# Patient Record
Sex: Female | Born: 2000 | Race: Black or African American | Hispanic: No | Marital: Married | Smoking: Never smoker
Health system: Southern US, Community
[De-identification: ages and names within clinical notes are randomized; demographics above are authoritative.]

---
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
## 2021-10-07 ENCOUNTER — Emergency Department (HOSPITAL_BASED_OUTPATIENT_CLINIC_OR_DEPARTMENT_OTHER)
Admission: EM | Admit: 2021-10-07 | Discharge: 2021-10-07 | Disposition: A | Discharge status: Home / Self Care | Payer: Medicaid Other | Attending: Emergency Medicine | Admitting: Emergency Medicine

## 2021-10-07 ENCOUNTER — Encounter (HOSPITAL_BASED_OUTPATIENT_CLINIC_OR_DEPARTMENT_OTHER): Payer: Self-pay

## 2021-10-07 ENCOUNTER — Emergency Department (HOSPITAL_BASED_OUTPATIENT_CLINIC_OR_DEPARTMENT_OTHER): Payer: Medicaid Other | Admitting: Radiology

## 2021-10-07 ENCOUNTER — Other Ambulatory Visit: Payer: Self-pay

## 2021-10-07 DIAGNOSIS — U071 COVID-19: Secondary | ICD-10-CM | POA: Diagnosis not present

## 2021-10-07 DIAGNOSIS — R0602 Shortness of breath: Secondary | ICD-10-CM | POA: Diagnosis present

## 2021-10-07 LAB — RESP PANEL BY RT-PCR (FLU A&B, COVID) ARPGX2
Influenza A by PCR: NEGATIVE
Influenza B by PCR: NEGATIVE
SARS Coronavirus 2 by RT PCR: POSITIVE — AB

## 2021-10-07 NOTE — ED Notes (Signed)
Patient verbalizes understanding of discharge instructions. Opportunity for questioning and answers were provided. Patient discharged from ED.  °

## 2021-10-07 NOTE — ED Provider Notes (Signed)
[facility]  Provider Note  CSN: [id number] Arrival date & time: 10/07/21 1336  History Chief Complaint  Patient presents with   Shortness of Breath    Karen Yoder is a 21 y.o. female with history of PCOS and anxiety reports she has been feeling poorly for 4-5 days with nasal congestion, ear pain and cough. No fever. She has been more anxious and she has been using her inhaler and that has made her heart race. She denies any N/V/D.    Home Medications Prior to Admission medications   Not on File     Allergies    Patient has no known allergies.   Review of Systems   Review of Systems Please see HPI for pertinent positives and negatives  Physical Exam BP 135/79    Pulse (!) 116    Temp 98.5 F (36.9 C)    Resp (!) 22    Ht 5\' 2"  (1.575 m)    Wt 80 kg    LMP 09/25/2021    SpO2 100%    BMI 32.26 kg/m   Physical Exam Vitals and nursing note reviewed.  Constitutional:      Appearance: Normal appearance.  HENT:     Head: Normocephalic and atraumatic.     Right Ear: Tympanic membrane normal.     Left Ear: Tympanic membrane normal.     Nose: Nose normal.     Mouth/Throat:     Mouth: Mucous membranes are moist.  Eyes:     Extraocular Movements: Extraocular movements intact.     Conjunctiva/sclera: Conjunctivae normal.  Cardiovascular:     Rate and Rhythm: Normal rate.  Pulmonary:     Effort: Pulmonary effort is normal.     Breath sounds: Normal breath sounds.  Abdominal:     General: Abdomen is flat.     Palpations: Abdomen is soft.     Tenderness: There is no abdominal tenderness.  Musculoskeletal:        General: No swelling. Normal range of motion.     Cervical back: Neck supple.  Skin:    General: Skin is warm and dry.  Neurological:     General: No focal deficit present.     Mental Status: She is alert.  Psychiatric:        Mood and Affect: Mood normal.    ED Results / Procedures / Treatments   EKG EKG  Interpretation  Date/Time:  Tuesday October 07 2021 13:46:53 EST Ventricular Rate:  137 PR Interval:  118 QRS Duration: 60 QT Interval:  282 QTC Calculation: 425 R Axis:   79 Text Interpretation: Sinus tachycardia Otherwise normal ECG When compared with ECG of 28-Nov-2019 20:55, No significant change was found Confirmed by Regan Lemming ([id number]) on 10/07/2021 1:56:58 PM  Procedures Procedures  Medications Ordered in the ED Medications - No data to display  Initial Impression and Plan  Patient with URI symptoms and anxiety. Monitor shows sinus tachy. I personally viewed the images from radiology studies and agree with radiologist interpretation: CXR is clear. Covid is positive. Patient has been vaccinated. No significant risk factors for worsening. Doubt she would benefit from antivirals at this point. No hypoxia in the ED. Low suspicion for PE. Recommend she continue with symptomatic care at home. Stay hydrated, RTED for any other concerns.    ED Course       MDM Rules/Calculators/A&P Medical Decision Making Given presenting complaint, I considered that admission might be necessary. After review of results from  ED lab and/or imaging studies, admission to the hospital is not indicated at this time.    Problems Addressed: COVID-19: acute illness or injury  Amount and/or Complexity of Data Reviewed Radiology: ordered and independent interpretation performed. Decision-making details documented in ED Course. ECG/medicine tests: ordered and independent interpretation performed. Decision-making details documented in ED Course.  Risk Prescription drug management. Decision regarding hospitalization.    Final Clinical Impression(s) / ED Diagnoses Final diagnoses:  [id number]    Rx / DC Orders ED Discharge Orders     None        Truddie Hidden, MD 10/07/21 1544

## 2021-10-07 NOTE — ED Triage Notes (Signed)
Patient here POV from Home with SOB.  Patient endorses URI Symptoms since Saturday. Congestion, Productive Cough, and SOB. Most Symptoms have become better with prescription Medications but states SOB has become worse.  No Known Fevers. No Body Aches.   Uses Inhaler but has no confirmed history of Asthma.   Anxious in Triage. A&Ox4. GCS 15. Ambulatory.

## 2022-05-21 ENCOUNTER — Institutional Professional Consult (permissible substitution): Payer: Medicaid Other | Admitting: Pulmonary Disease

## 2023-03-15 ENCOUNTER — Emergency Department (HOSPITAL_BASED_OUTPATIENT_CLINIC_OR_DEPARTMENT_OTHER)
Admission: EM | Admit: 2023-03-15 | Discharge: 2023-03-15 | Disposition: A | Discharge status: Home / Self Care | Payer: Medicaid Other | Source: Home / Self Care | Attending: Emergency Medicine | Admitting: Emergency Medicine

## 2023-03-15 ENCOUNTER — Emergency Department (HOSPITAL_BASED_OUTPATIENT_CLINIC_OR_DEPARTMENT_OTHER): Payer: Medicaid Other

## 2023-03-15 DIAGNOSIS — M25571 Pain in right ankle and joints of right foot: Secondary | ICD-10-CM | POA: Insufficient documentation

## 2023-03-15 DIAGNOSIS — X501XXA Overexertion from prolonged static or awkward postures, initial encounter: Secondary | ICD-10-CM | POA: Diagnosis not present

## 2023-03-15 LAB — PREGNANCY, URINE: Preg Test, Ur: NEGATIVE

## 2023-03-15 NOTE — Discharge Instructions (Signed)
Wear walking boot for comfort.  Recommend Tylenol and ibuprofen and ice for pain.  Follow-up with orthopedics.  Please return if symptoms worsen.  Overall I suspect you have ankle sprain.  There was questionable old injury to the foot bone called the talus but I have immobilized you with a brace to help.

## 2023-03-15 NOTE — ED Notes (Signed)
DC papers reviewed. No questions or concerns. No signs of distress. Pt assisted to wheelchair and out to lobby. Appropriate measures for safety taken. 

## 2023-03-15 NOTE — ED Provider Notes (Signed)
[place] [facility] AT [facility] Provider Note   CSN: [id number] Arrival date & time: 03/15/23  1711     History  Chief Complaint  Patient presents with   Ankle Pain    R    Karen Yoder is a 22 y.o. female.  Patient here with pain to the right ankle.  Difficult medical history.  She was sitting awkwardly yesterday on a beanbag for a long period of time with her foot plantarflexed.  No specific trauma otherwise.  Woke up with some swelling to the right ankle.  No prior injuries.  Denies any chest pain or shortness of breath or weakness numbness or tingling.  Pain when she ambulates.  The history is provided by the patient.       Home Medications Prior to Admission medications   Not on File      Allergies    Patient has no known allergies.    Review of Systems   Review of Systems  Physical Exam Updated Vital Signs BP (!) 145/81   Pulse 99   Temp 97.6 F (36.4 C)   Resp 16   SpO2 100%  Physical Exam Vitals and nursing note reviewed.  Constitutional:      General: She is not in acute distress.    Appearance: She is well-developed.  HENT:     Head: Normocephalic and atraumatic.  Eyes:     Conjunctiva/sclera: Conjunctivae normal.  Cardiovascular:     Rate and Rhythm: Normal rate and regular rhythm.     Pulses: Normal pulses.     Heart sounds: No murmur heard. Pulmonary:     Effort: Pulmonary effort is normal. No respiratory distress.     Breath sounds: Normal breath sounds.  Abdominal:     Palpations: Abdomen is soft.     Tenderness: There is no abdominal tenderness.  Musculoskeletal:        General: Swelling and tenderness present. Normal range of motion.     Cervical back: Neck supple.     Comments: Tenderness to the top of the right foot, some swelling to the medial malleolus area, good range of motion without much discomfort, no pain elsewhere  Skin:    General: Skin is warm and dry.     Capillary Refill: Capillary refill  takes less than 2 seconds.  Neurological:     Mental Status: She is alert.     Sensory: No sensory deficit.     Motor: No weakness.  Psychiatric:        Mood and Affect: Mood normal.     ED Results / Procedures / Treatments   Labs (all labs ordered are listed, but only abnormal results are displayed) Labs Reviewed  PREGNANCY, URINE    EKG None  Radiology DG Ankle Complete Right  Result Date: 03/15/2023 CLINICAL DATA:  Injury.  Swelling. EXAM: RIGHT ANKLE - COMPLETE 3+ VIEW COMPARISON:  None Available. FINDINGS: Small curvilinear density adjacent to the dorsal talus may represent an avulsion fracture of unknown acuity. No additional fracture. The alignment is maintained. Normal ankle mortise. No erosions or focal bone abnormality. Generalized soft tissue edema. No definite ankle joint effusion. IMPRESSION: Small curvilinear density adjacent to the dorsal talus may represent an avulsion fracture of unknown acuity. Generalized soft tissue edema. Electronically Signed   By: Narda Rutherford M.D.   On: 03/15/2023 18:34    Procedures Procedures    Medications Ordered in ED Medications - No data to display  ED Course/ Medical Decision  Making/ A&P                             Medical Decision Making Amount and/or Complexity of Data Reviewed Labs: ordered. Radiology: ordered.   Karen Yoder is here with right ankle pain.  Differential diagnosis sprain versus less likely fracture/contusion.  Unremarkable vitals.  Neurovascular neuromuscular intact on exam.  Radiology report shows small curvilinear density adjacent to the dorsal talus may represent an avulsion fracture unknown acuity.  There is some generalized edema.  It is possible that there may be a small fracture.  She not particularly tender in the dorsal talus area.  Will put in a walking boot for sprain/contusion/nondisplaced fracture.  Overall she is neurovascular neuromuscular intact.  Will have her follow-up with orthopedics.   She will use Tylenol, ibuprofen and ice.  Weightbearing as tolerated with crutches.  Discharged in good condition.  Understands return precautions.        Final Clinical Impression(s) / ED Diagnoses Final diagnoses:  Acute right ankle pain    Rx / DC Orders ED Discharge Orders     None         Virgina Norfolk, DO 03/15/23 1949

## 2023-03-15 NOTE — ED Triage Notes (Signed)
Pt states she thinks she has "high ankle sprain," swelling, burning up top. States she was standing on tip toes on beanbag, but denies specific known injury/ "nothing major." CNS intact distal to injury

## 2023-07-31 IMAGING — DX DG CHEST 2V
2 series · 2 of 2 positions shown · non-contrast
Comparison: November 28, 2019.

CLINICAL DATA: SOB

EXAM:
CHEST - 2 VIEW

[chest pa]
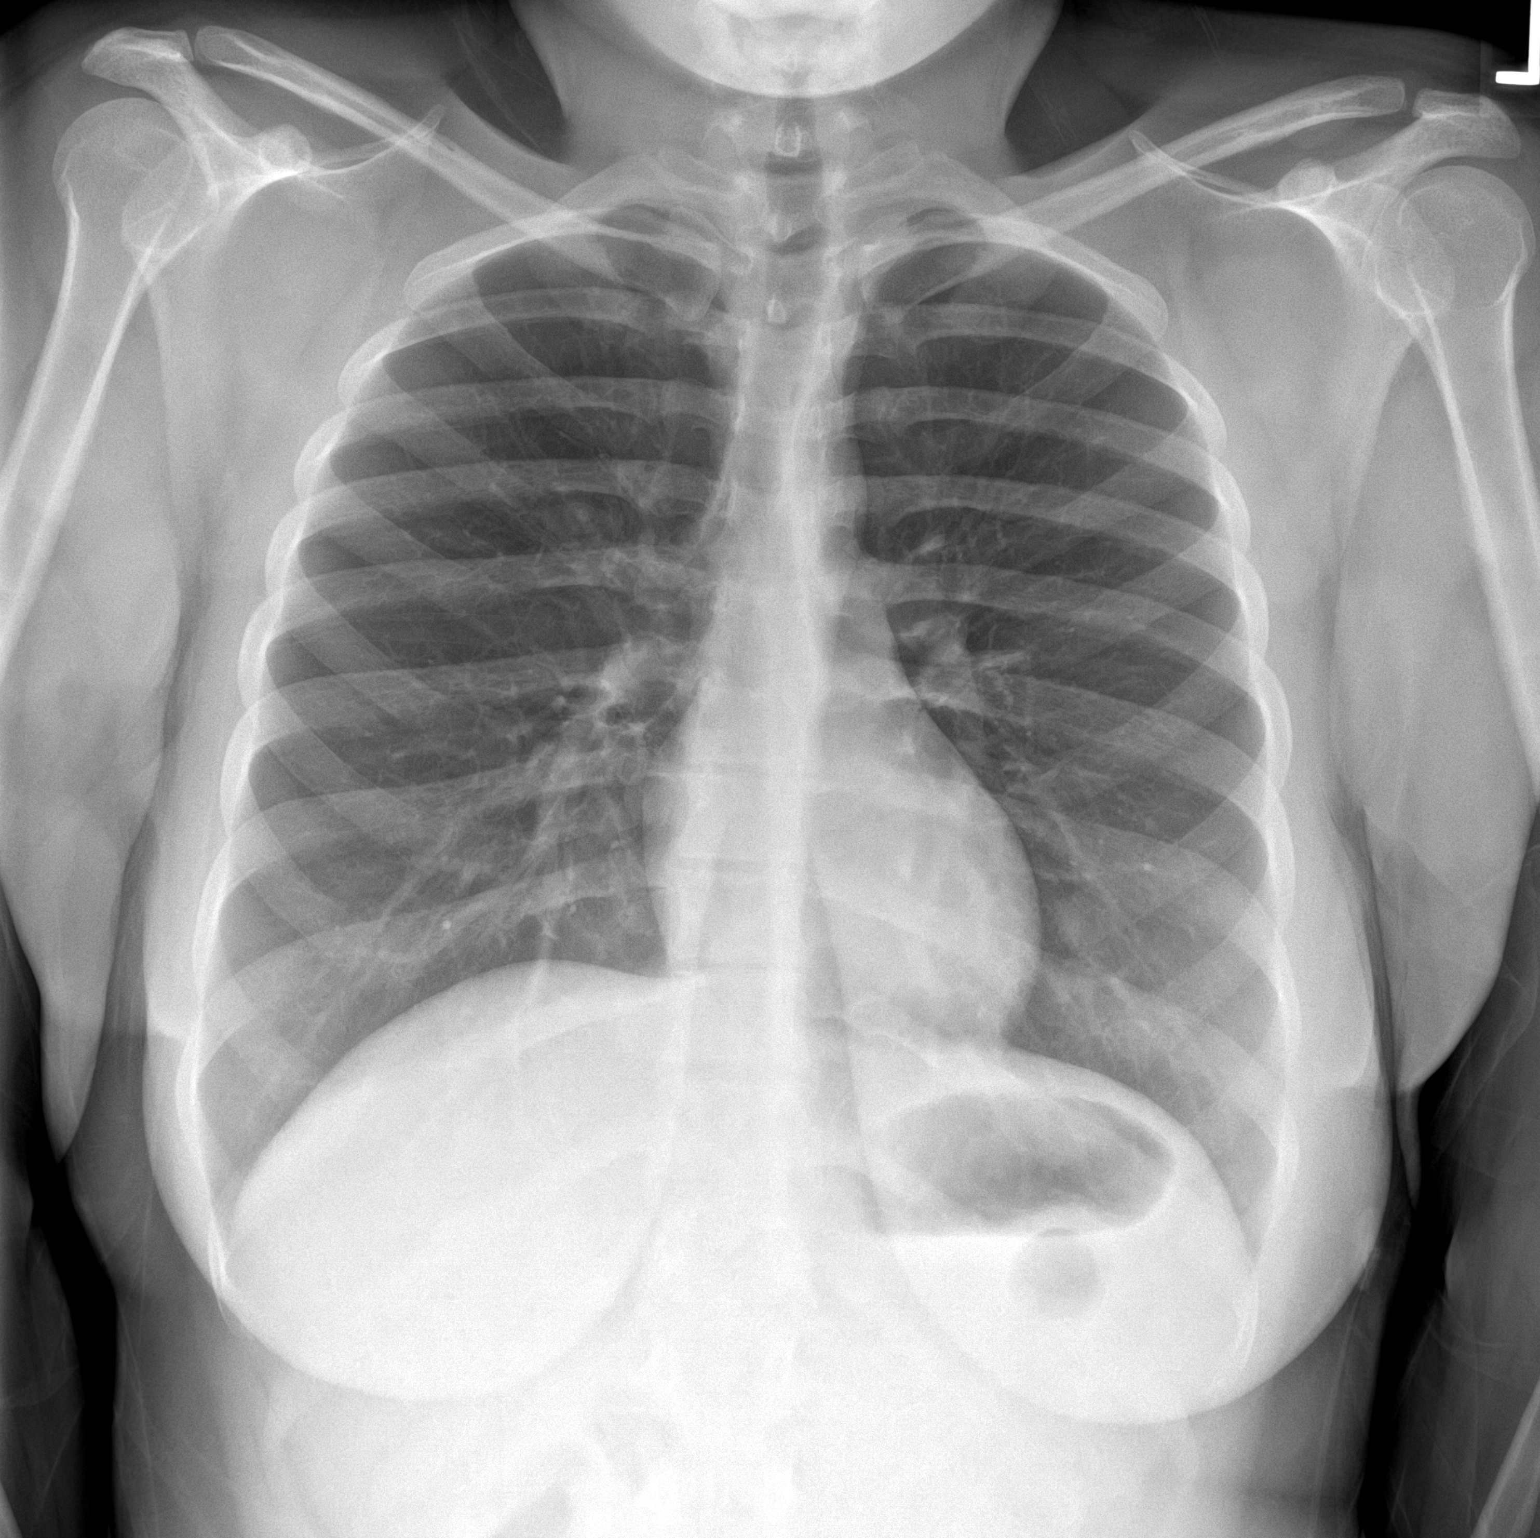

[chest lat]
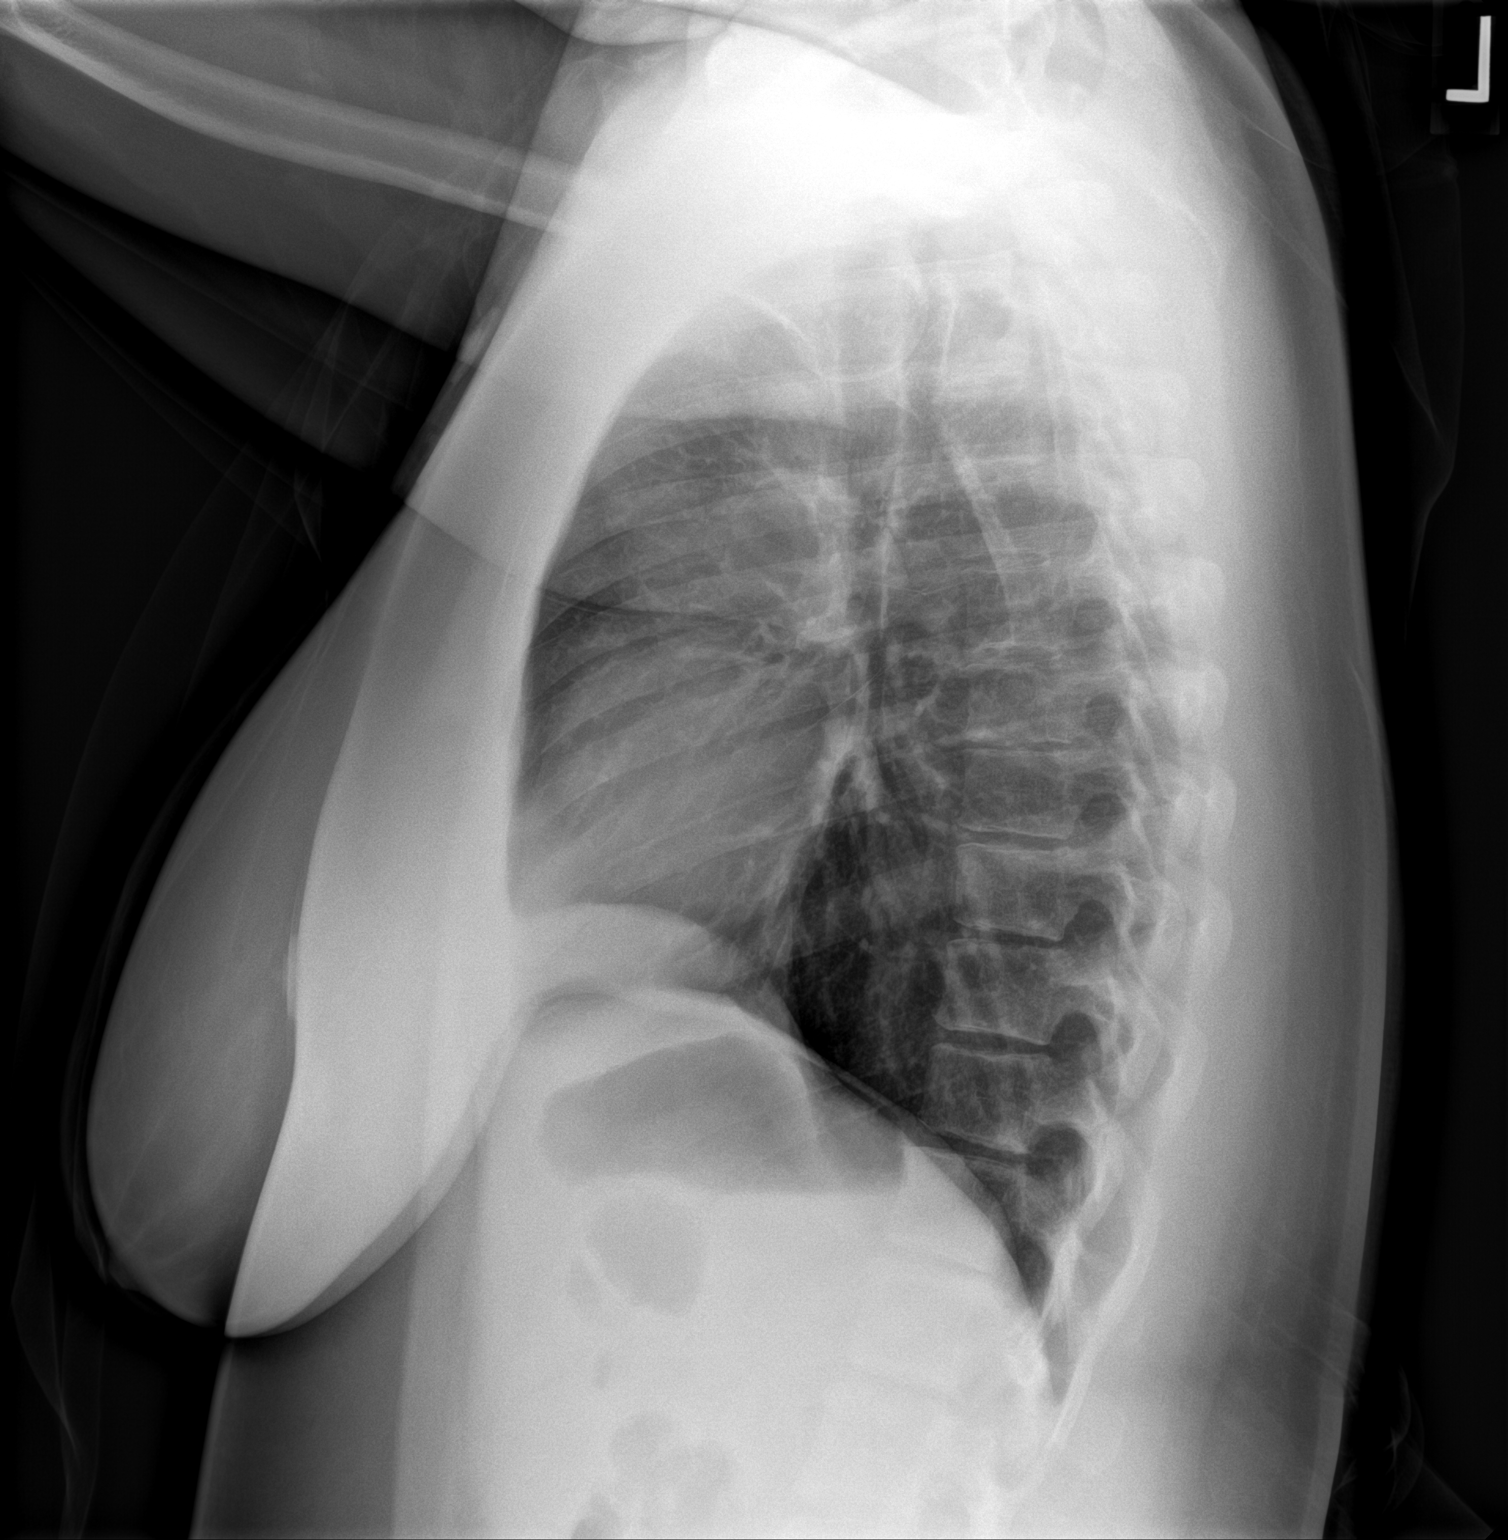

[2 of 2 positions shown; findings below may reference images not displayed]

FINDINGS: No consolidation. No visible pleural effusions or pneumothorax.
Cardiomediastinal silhouette is within normal limits. No change from
the prior.
IMPRESSION: No evidence of acute cardiopulmonary disease.

## 2024-04-28 NOTE — Progress Notes (Signed)
 Assessment and Plan   1. Annual physical exam  CBC And Differential   Comprehensive Metabolic Panel   Lipid Panel With LDL/HDL Ratio   Lipid Panel With LDL/HDL Ratio   Comprehensive Metabolic Panel   CBC And Differential    2. PCOS (polycystic ovarian syndrome)  Ambulatory referral to Obstetrics / Gynecology   POCT A1C   POCT A1C    3. Anxiety, generalized      4. Seasonal allergies      5. Major depressive disorder, single episode, severe (*)      6. Screening for cervical cancer  IGP, Aptima HPV, rfx 16/18,45 Cervical   IGP, Aptima HPV, rfx 16/18,45 Cervical    7. Screen for STD (sexually transmitted disease)      8. Dizziness  Fe+TIBC+Fer   Vitamin D 25 Hydroxy   TSH+Free T4   Vitamin B12   BV, Candida, TV, CT, & GC, NAA vag swab Vaginal   BV, Candida, TV, CT, & GC, NAA vag swab Vaginal   Vitamin B12   TSH+Free T4   Vitamin D 25 Hydroxy   Fe+TIBC+Fer    9. PND (post-nasal drip)      10. Nasal congestion      11. Urinary frequency  Urinalysis   Urinalysis    12. Gastroesophageal reflux disease without esophagitis  omeprazole (PRILOSEC) 40 mg capsule     Assessment & Plan  1.  Wellness - see orders placed for this visit. The patient is asked to make an attempt to improve diet and exercise patterns to aid in health maintenance.  Immunizations have been reviewed and updated as noted.  Recommend repeat health maintenance exam in 1 year(s).  See patient instructions for further information.  Screening blood work CMP CBC lipid.  Cervical cancer screening Pap smear performed STD screening vaginal swab performed.    Issues addressed outside scope of physical:  PCOS check an A1c refer to gynecology in [place] she does not want to drive anywhere other than [place] most recently seen in the [place] once an area.  Recently started on Prometrium  Seasonal allergies-recently had a flare advised to continue with Zyrtec azelastine nasal spray Pataday, and  Flonase as needed  GERD not to goal discussed foods to avoid and will place her on omeprazole 40 mg daily each morning for a few weeks if improved may just take as needed or mom down to a Pepcid  Asthma stable with albuterol as needed  Urinary frequency check urinalysis  Dizziness possibly related to seasonal allergies but will also check blood work and continue to monitor.  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 Reflects encounter med changes as of last refresh          New Prescriptions      Instructions  omeprazole 40 mg capsule Commonly known as: PRILOSEC Started by: Comer Hemberg  40 mg, Oral, Daily  Continued Medications      Instructions  * Albuterol Sulfate 108 (90 Base) MCG/ACT Aepb inhaler Commonly known as: ProAir Respiclick  2 puffs, Inhalation, Every 6 hours as needed   * albuterol sulfate HFA 108 (90 Base) MCG/ACT inhaler Commonly known as: PROVENTIL,VENTOLIN,PROAIR  2 puffs, Inhalation, Every 6 hours   azelastine 0.1 % nasal spray Commonly known as: ASTELIN  2 sprays, Both Nostrils, 2 times a day   cetirizine 10 mg tablet Commonly known as: ZYRTEC  10 mg, Oral, At bedtime   escitalopram oxalate 20 mg tablet Commonly known as: LEXAPRO  20 mg, Oral, Daily   fluticasone propionate 50 mcg/actuation nasal spray Commonly known as: FLONASE  1-2 sprays, Nasal, Daily   olopatadine HCl  0.2 % ophthalmic solution Commonly known as: PATADAY  1 drop, Both Eyes, Daily, Instill one drop in each eye q day.   progesterone 200 mg capsule Commonly known as: PROMETRIUM  TAKE 1 CAPSULE BY MOUTH DAILY AT BEDTIME FOR CYCLE DAYS 18-27   VITAMIN D PO  Take by mouth.      * * This list has 2 medication(s) that are the same as other medications prescribed for you. Read the directions carefully, and ask your doctor or other care provider to review them with you.          Discontinued Medications    fish oil 1000 mg Caps Commonly known as: SEA-OMEGA Stopped by: Comer Reid   metFORMIN ER 500 mg 24 hr tablet Commonly known as: GLUCOPHAGE-XR Stopped by: Comer Reid   MYODYNE EX Stopped by: Comer Reid       Orders Placed This Encounter  Procedures  . BV, Candida, TV, CT, & GC, NAA vag swab Vaginal  . IGP, Aptima HPV, rfx 16/18,45 Cervical  . CBC And Differential  . Comprehensive Metabolic Panel  . Lipid Panel With LDL/HDL Ratio  . Fe+TIBC+Fer  . Vitamin D 25 Hydroxy  . TSH+Free T4  . Vitamin B12  . Urinalysis  . Ambulatory referral to Obstetrics / Gynecology  . POCT A1C    Risks, benefits, and alternatives of the medications and treatment plan prescribed today were discussed, and patient expressed understanding. Plan follow-up as discussed or as needed if any worsening symptoms or change in condition.       Subjective   Patient ID:  Karen Yoder is a 23 y.o. (DOB 10/31/2000) female.     Patient presents with  . Medication Management    Req. TSH checked, and Pap, also discuss overall health    Karen Yoder presents today for her annual physical exam.  Her fianc is with her today.  They live together she does not have any children.  She reports she quit smoking marijuana and quit drinking alcohol a month ago.  Admittedly was smoking about 2 blunts a day and had episodes of binge drinking.  Last visit with me July 2024  Additional  complaints beyond the scope of the Wellness Exam include:  She reports many complaints today that she would like addressed.  She reports over the last month or 2 she has had urinary frequency night sweats shortness of breath at rest chest congestion postnasal drip lightheadedness dizziness vision blurred sore throat sore tongue coughing up some mucus blood noted in the mucus a small amount, some pain to the right side of her neck unsure if it is a muscle or something else, increased reflux and burping, she does have seasonal allergies currently back on her allergy medications  for the past month, reports ongoing PCOS cysts on her ovaries and has seen gynecology, desires all cancer screenings is much as possible due for Pap smear.  Would like thyroid levels checked.  Reports she has had decreased immune system over the past year seems to get sick often.  She is taking Lexapro although admittedly taking on and off for the last year but more consistently over the last few months.  Has been seen by gynecology started on Prometrium but would like to see a gynecologist in [place] which is closer for her to drive   History of Present Illness   Results  Allergies[1]  Medications Taking[2]  Past Medical History:  Diagnosis Date  . Asthma (*)   . Overweight(278.02)    Past Surgical History:  Procedure Laterality Date  . No known surgical history  11/24/2017   Social History[3]  Family History  Problem Relation Age of Onset  . Ovarian cysts Mother   . No Known Problems Father   . Breast cancer Neg Hx   . Ovarian cancer Neg Hx   . Colon cancer Neg Hx   . Diabetes Neg Hx   . Hypertension Neg Hx    Immunization History  Administered Date(s) Administered  . DTaP (Infanrix) 12/27/2000, 03/14/2001, 05/05/2001, 06/09/2002, 04/05/2006  . Gardasil 9 12/27/2015, 07/06/2018, 10/12/2018  . HPV Quadrivalent 05/05/2013  . Hepatitis A Pediatric/Adolescent 2 Dose (Havrix,Vaqta) 04/18/2010, 05/05/2013   . Hepatitis B adolescent/pediatric [birth year] 2000, 11/22/2000, 12/30/2001  . Hib, Unspecified Formulation 12/27/2000, 03/14/2001, 05/05/2001, 06/09/2002  . IPV 12/27/2000, 03/14/2001, 05/05/2001, 04/05/2006  . Influenza Fluzone Quad 0.72ml 06/08/2018  . Influenza Fluzone Quad 5ml 06/09/2002  . MMR 12/30/2001, 04/05/2006  . Meningococcal ACWY vaccine, unspecified formulation 05/05/2013  . Meningococcal Con - Menactra 11/24/2017  . Pfizer-BioNTech COVID-19 Monovalent Vaccine mRNA 03/09/2020, 03/30/2020  . Pneumococcal Conjugate 7 (Prevnar) 12/27/2000, 03/14/2001, 05/05/2001, 06/09/2002  . Tdap 05/05/2013  . Varicella 12/30/2001, 04/18/2010    Review of Systems  Constitutional:  Negative for activity change, appetite change, chills, diaphoresis, fatigue, fever and unexpected weight change.  HENT:  Positive for congestion, postnasal drip, sinus pressure and sore throat. Negative for ear discharge, ear pain, hearing loss, mouth sores, rhinorrhea, sneezing, tinnitus, trouble swallowing and voice change.   Eyes:  Positive for visual disturbance.  Respiratory:  Positive for shortness of breath. Negative for cough, chest tightness and wheezing.   Cardiovascular:  Positive for chest pain. Negative for palpitations and leg swelling.  Gastrointestinal:  Negative for abdominal distention, abdominal pain, blood in stool, constipation, diarrhea, nausea and vomiting.  Endocrine: Negative for cold intolerance, heat intolerance, polydipsia, polyphagia and polyuria.  Genitourinary:  Positive for frequency and menstrual problem. Negative for dysuria, urgency and vaginal discharge.  Musculoskeletal:  Negative for arthralgias, back pain, gait problem, myalgias, neck pain and neck stiffness.  Skin:  Negative for color change, rash and wound.  Neurological:  Positive for dizziness and light-headedness. Negative for tremors, weakness, numbness and headaches.  Hematological:  Negative for adenopathy. Does not  bruise/bleed easily.  Psychiatric/Behavioral:  Negative for agitation, dysphoric mood and sleep disturbance. The patient is nervous/anxious.      Objective   Vitals:   04/28/24 1441  BP: 126/86  Pulse: 107  Resp: 18  Temp: 97.3 F (36.3 C)  SpO2: 99%   Physical Exam  Physical Exam Vitals and nursing note reviewed.  Constitutional:      General: She is not in acute distress.    Appearance: She is well-developed. She is not  diaphoretic.  HENT:     Head: Normocephalic and atraumatic.     Right Ear: Tympanic membrane, ear canal and external ear normal.     Left Ear: Tympanic membrane, ear canal and external ear normal.     Nose: Congestion present.     Right Sinus: No maxillary sinus tenderness or frontal sinus tenderness.     Left Sinus: No maxillary sinus tenderness or frontal sinus tenderness.     Mouth/Throat:     Lips: Pink.     Mouth: Mucous membranes are moist.     Pharynx: Postnasal drip present. No oropharyngeal exudate.     Tonsils: No tonsillar exudate.  Eyes:     General:        Right eye: No discharge.        Left eye: No discharge.     Conjunctiva/sclera: Conjunctivae normal.     Pupils: Pupils are equal, round, and reactive to light.  Neck:     Thyroid: No thyroid mass, thyromegaly or thyroid tenderness.     Vascular: No carotid bruit or JVD.     Trachea: No tracheal deviation.      Comments: Muscular tenderness to this area Cardiovascular:     Rate and Rhythm: Normal rate and regular rhythm.     Heart sounds: Normal heart sounds. No murmur heard.    No friction rub. No gallop.  Musculoskeletal:        General: Normal range of motion.     Cervical back: Normal range of motion and neck supple. Muscular tenderness present. No spinous process tenderness.  Pulmonary:     Effort: Pulmonary effort is normal. No respiratory distress.     Breath sounds: Normal breath sounds. No wheezing or rales.  Abdominal:     General: Bowel sounds are normal. There is no  distension.     Palpations: Abdomen is soft. There is no mass.     Tenderness: There is no abdominal tenderness. There is no guarding or rebound.  Lymphadenopathy:     Cervical: No cervical adenopathy.  Skin:    General: Skin is warm and dry.     Findings: No erythema or rash.  Neurological:     Mental Status: She is alert and oriented to person, place, and time.     Cranial Nerves: No cranial nerve deficit.     Motor: No abnormal muscle tone.     Coordination: Coordination normal.     Deep Tendon Reflexes: Reflexes are normal and symmetric. Reflexes normal.  Psychiatric:        Attention and Perception: Attention normal.        Mood and Affect: Mood is anxious.        Speech: Speech normal.        Behavior: Behavior normal.        Thought Content: Thought content normal.        Cognition and Memory: Cognition normal.        Judgment: Judgment normal.     Recent Results (from the past 2 weeks)  Urinalysis   Collection Time: 04/28/24  3:06 PM  Result Value Ref Range   Urine Color Yellow    Urine Clarity Clear    Urine Glucose Negative Negative mg/dL   Urine Bilirubin Negative Negative mg/dL   Urine Ketones Negative Negative mg/dl   Urine Specific Gravity >=1.030 1.005 - 1.030   Urine pH 6.0 5.0 - 9.0   Urine Protein - Dipstick Negative Negative mg/dl   Urine  Urobilinogen 0.2 0.2 , 1.0 mg/dl   Urine Nitrite Negative Negative   Urine Leukocyte Esterase Negative Negative Leu/mcL   Urine Blood Small (A) Negative mg/dL  POCT J8R   Collection Time: 04/28/24  3:06 PM  Result Value Ref Range   Hemoglobin A1c 5.4 4.8 - 5.6 %  CBC And Differential   Collection Time: 04/28/24  3:06 PM  Result Value Ref Range   WBC 4.4 3.7 - 11.0 thou/mcL   RBC 5.02 (H) 4.01 - 4.90 million/mcL   HGB 14.0 12.2 - 14.9 gm/dL   HCT 58.2 64.1 - 52.0 %   MCV 83.1 82.0 - 98.0 fL   MCH 27.9 27.0 - 33.0 pg   MCHC 33.6 31.0 - 37.0 gm/dL   Plt Ct 742 849 - 599 thou/mcL   RDW CV 13.1 11.8 - 14.9 %    NEUTROPHIL % 53.0 %   LYMPHOCYTE % 39.6 %   MID% 7.4 %   ABSOLUTE NEUTROPHIL COUNT 2.30 1.50 - 7.50 thou/mcL   ABSOLUTE LYMPHOCYTE COUNT 1.70 1.00 - 4.50 thou/mcL   ABSOLUTE MID <0.5 0.1 - 1.5 thou/mcL    Portions of the history and exam were entered using voice recognition software.  Minor syntax and spelling errors may be related to the use of this software and were not intentional.  Attestation         [1] Allergies Allergen Reactions  . Pollen Extract Itching  . Other Dizziness  . Seasonal Other  [2] Outpatient Medications Marked as Taking for the 04/28/24 encounter (Office Visit) with Katherine Hemberg V, NP  Medication Sig Dispense Refill  . Albuterol Sulfate (PROAIR RESPICLICK) 108 (90 Base) MCG/ACT AEPB inhaler Inhale two puffs into the lungs every 6 (six) hours as needed for Wheezing. 1 each 5  . albuterol sulfate HFA (PROVENTIL,VENTOLIN,PROAIR) 108 (90 Base) MCG/ACT inhaler Inhale two puffs into the lungs every 6 (six) hours. 108 g 1  . azelastine (ASTELIN) 0.1 % nasal spray two sprays by Both Nostrils route 2 (two) times daily. 30 mL 0  . cetirizine (ZYRTEC) 10 mg tablet TAKE 1 TABLET BY MOUTH EVERYDAY AT BEDTIME 30 tablet 3  . escitalopram oxalate (LEXAPRO) 20 mg tablet TAKE ONE TABLET BY MOUTH DAILY. 30 tablet 2  . fluticasone propionate (FLONASE) 50 mcg/actuation nasal spray one spray to two sprays by Nasal route daily. 16 g PRN  . olopatadine HCl (PATADAY) 0.2 % ophthalmic solution Place one drop into both eyes daily. Instill one drop in each eye q day. 2.5 mL 3  . progesterone (PROMETRIUM) 200 mg capsule TAKE 1 CAPSULE BY MOUTH DAILY AT BEDTIME FOR CYCLE DAYS 18-27 30 capsule 0  . VITAMIN D PO Take by mouth.    [3] Social History Socioeconomic History  . Marital status: Significant Other  Occupational History  . Occupation: [employer] at [employer]  . Smoking status: Never  . Smokeless tobacco: Never  Vaping Use  . Vaping status: Never Used   Substance and Sexual Activity  . Alcohol use: Not Currently  . Drug use: Yes    Types: Marijuana    Comment: THC products  . Sexual activity: Yes    Partners: Male    Birth control/protection: None  *Some images could not be shown.

## 2024-05-05 NOTE — Progress Notes (Signed)
 Otolaryngology Clinic Note  HPI:    New Patient (Pt presents for shortness of breath, post nasal drip, congestion and itchy  throat  )    Karen Yoder is a 23 y.o. female who presents as a new patient, referred by No ref. provider found, for evaluation and treatment of shortness of breath, congestion, and acid reflux.  She has been experiencing persistent shortness of breath and congestion for the past 4 years. She also reports a sensation of throat thickness, chest congestion, constant postnasal drip, and sinus pressure. Her nasal passages are often blocked, although they occasionally clear up. The pressure is typically worse on the left side. She suspects inflammation in her nasal tissue. She has a history of smoking marijuana with nicotine wraps for 7 years but quit a month ago. She contracted COVID-19 four times about 2 years ago. She has a lifelong history of allergies requiring medication, but she has never undergone allergy testing. She knows she is allergic to pollen. She takes cetirizine and Flonase year-round for her allergies.  She was recently diagnosed with asthma by her primary care physician and uses an albuterol pump. Her shortness of breath is interfering with her work and physical activities. She has acid reflux and started taking medication for it about a week ago. She had an ear infection 2 to 3 years ago that required antibiotic treatment. She denies history of sinonasal surgery or nasal trauma.  PMH/Meds/All/SocHx/FamHx/ROS:   Medical History[1]  Surgical History[2]  No family history of bleeding disorders, wound healing problems or difficulty with anesthesia.   Social History   Socioeconomic History  . Marital status: Single    Spouse name: Not on file  . Number of children: Not on file  . Years of education: Not on file  . Highest education level: Not on file  Occupational History  . Not on file  Tobacco Use  . Smoking status: Former    Types: Cigarettes   . Smokeless tobacco: Never  . Tobacco comments:    black and milds/ one every other day  Substance and Sexual Activity  . Alcohol use: Yes  . Drug use: Yes    Types: Marijuana  . Sexual activity: Not on file    Comment: some unprotected sex at times  Other Topics Concern  . Not on file  Social History Narrative   Pt lives with mom, stepdad and three siblings. Goes to [employer], in 7th grade.   Social Drivers of Health   Food Insecurity: No Food Insecurity (04/28/2024)   Received from [facility]   Food vital sign   . Within the past 12 months, you worried that your food would run out before you got money to buy more: Never true   . Within the past 12 months, the food you bought just didn't last and you didn't have money to get more: Never true  Transportation Needs: No Transportation Needs (04/28/2024)   Received from [facility] - Transportation   . In the past 12 months, has lack of transportation kept you from medical appointments or from getting medications?: No   . In the past 12 months, has lack of transportation kept you from meetings, work, or from getting things needed for daily living?: No  Safety: Not on file  Living Situation: Low Risk  (04/28/2024)   Received from [facility] Situation   . In the last 12 months, was there a time when you were not able to  pay the mortgage or rent on time?: No   . In the past 12 months, how many times have you moved where you were living?: 0   . At any time in the past 12 months, were you homeless or living in a shelter (including now)?: No    Current Medications[3]  A complete ROS was performed with pertinent positives/negatives noted in the HPI. The remainder of the ROS are negative.    Physical Exam:    BP 121/76   Pulse 89   Temp 98 F (36.7 C) (Temporal)   Ht 1.6 m (5' 3)   Wt 93.4 kg (205 lb 12.8 oz)   BMI 36.46 kg/m    General: Well developed, well nourished. No acute distress. Voice  normal, no voice breaks  Head/Face: Normocephalic, atraumatic. No scars or lesions. No sinus tenderness. Facial nerve intact and equal bilaterally.   Eyes: Pupils are equal, round and reactive to light. Conjunctiva and lids are normal. Normal extraocular mobility.   Ears:   Right: Pinna and external meatus normal, normal ear canal skin and caliber without excessive cerumen or drainage.    Left: Pinna and external meatus normal, normal ear canal skin and caliber without excessive cerumen or drainage.   Nose: No gross deformity or lesions. No purulent discharge. Septum midline, 2-3+ turbinate hypertrophy with allergic appearing mucosa.  Mouth/Oropharynx: Lips normal, teeth and gums normal with good dentition, normal oral vestibule. Normal floor of mouth, tongue and oral mucosa, no mucosal lesions, ulcer or mass, normal tongue mobility. Uvula midline. Hard and soft palate normal with normal mobility. Symmetric 1+ tonsils, no erythema or exudate.  Larynx: See TFL if applicable  Nasopharynx: See TFL if applicable  Neck: Trachea midline. No masses. No crepitus. Normal thyroid glad palpation without thyromegaly or nodules palpated. Salivary glands normal to palpation without swelling, erythema or mass.   Lymphatic: No lymphadenopathy in the neck.  Respiratory: No stridor or distress.  Extremities: No edema or cyanosis. Warm and well-perfused.  Neurologic: CN II-XII intact. Alert and oriented to self, place and time.  Normal reflexes and motor skills, balance and coordination. Moving all extremities without gross abnormality.  Psychiatric:  No unusual anxiety or evidence of depression. Appropriate affect.    Independent Review of Additional Tests or Records:  None  Procedures:   Laryngoscopy  Date/Time: 05/05/2024 2:45 PM  Performed by: Gerard Jenkins Shope, DO Authorized by: Gerard Jenkins Shope, DO  Local anesthesia used: yes  Anesthesia: Local anesthesia used: yes Local Anesthetic:  topical anesthetic  Sedation: Patient sedated: no  Comments: Flexible Laryngoscopy Procedure Note  Indications: Globus sensation  Risks, benefits and clinical relevance of the study were discussed. The patient understands and agrees to proceed.   Procedure: After adequate topical anesthetic was applied, 4 mm flexible laryngoscope was passed through the nasal cavity without difficulty. Flexible laryngoscopy shows patent anterior nasal cavity with minimal crusting, no discharge or infection.  Mild interarytenoid edema and erythema consistent with patient's known history of GERD, otherwise normal base of tongue and supraglottis Normal vocal cord mobility without vocal cord nodule, mass, polyp or tumor. Hypopharynx normal without mass, pooling of secretions or aspiration.  Patient tolerated procedure without complication or difficulty.     Nasal/Sinus Endoscopy  Date/Time: 05/05/2024 2:45 PM  Performed by: Gerard Jenkins Shope, DO Authorized by: Gerard Jenkins Shope, DO  Local anesthesia used: yes  Anesthesia: Local anesthesia used: yes Local Anesthetic: topical anesthetic Comments: Procedure Note - Nasal Endoscopy   Risks/benefits and possible  complications of this procedure were discussed in detail and the patient understood and agreed to proceed.  The nose was sprayed with oxymetazoline and 4% lidocaine. With the patient in the upright position, the flexible endscope was inserted into the nasal passage bilaterally.  Where visible, nasal secretions and mucosal crusting were removed with suction. The overall appearance of the nasal cavity and paranasal sinuses were noted and the findings are described below.  Findings: Relatively midline nasal septum with spur noted along the floor the nose on the right posteriorly, with moderate allergic edema of bilateral inferior turbinates, clear rhinorrhea noted with no evidence of mucopurulent drainage, nasal polyps or focal  obstruction.   The patient tolerated the procedure without difficulty.         Impression & Plans:  Karen Yoder is a 23 y.o. female with history of allergic rhinitis, acid reflux, currently on PPI, asthma presenting for evaluation of shortness of breath, postnasal drainage, facial pressure.  Complete head neck examination today including fiberoptic laryngoscopy and nasal endoscopy with findings as above, overall reassuring.  No evidence of mucopurulent drainage, nasal obstruction, nasal polyps.  Patient has an arytenoid edema and erythema consistent with known history of GERD.  I recommend beginning Astelin nasal spray in conjunction with daily use of Flonase and antihistamine, and referral to allergy and immunology, given patient's symptoms appear to be largely allergy mediated.  Recommend she also discuss her shortness of breath symptoms with the allergist. We discussed dietary lifestyle modifications for treatment of reflux to include avoiding spicy and acidic foods which exacerbate reflux, avoiding late meals and snacking, avoiding caffeine and stimulants, avoiding carbonated and alcoholic beverages, adequate oral hydration, avoidance of throat clearing and sleeping with the head of bed elevated.  Follow-up with ENT as needed.  Meghan Jenkins Shope, DO Otolaryngology         [1] Past Medical History: Diagnosis Date  . Allergy   . Anxiety   . Headache(784.0)   . Mental disorder    depression   [2] Past Surgical History: Procedure Laterality Date  . NO PAST SURGERIES     Procedure: NO PAST SURGERIES  [3]  Current Outpatient Medications:  .  albuterol HFA (PROVENTIL HFA;VENTOLIN HFA;PROAIR HFA) 90 mcg/actuation inhaler, Inhale 2 puffs every 6 (six) hours., Disp: , Rfl:  .  albuterol sulfate (PROAIR RESPICLICK) 90 mcg/actuation aepb inhaler, Inhale 2 puffs every 6 (six) hours as needed., Disp: , Rfl:  .  cetirizine (ZyrTEC) 10 mg tablet, Take 10 mg by mouth nightly., Disp:  , Rfl:  .  cholecalciferol (VITAMIN D3) 125 mcg (5,000 unit) capsule, Take 5,000 Units by mouth daily., Disp: , Rfl:  .  fluticasone propionate (FLONASE) 50 mcg/spray nasal spray, Administer 2 sprays into each nostril., Disp: , Rfl:  .  norgestimate-ethinyl estradioL (SPRINTEC) 0.25-35 mg-mcg tab tablet, Take one tablet daily (no placebos) Note that 4 packs = 90 days supply, Disp: 112 tablet, Rfl: 3 .  omeprazole (PriLOSEC) 40 mg DR capsule, Take 40 mg by mouth daily., Disp: , Rfl:  .  azelastine (ASTELIN) 137 mcg (0.1 %) nasal spray, Administer 2 sprays into each nostril 2 (two) times a day. 1 or 2 sprays per nostril twice daily as needed, Disp: 30 mL, Rfl: 11 .  azithromycin (ZITHROMAX) 500 mg tablet, Take two tablets at same time (Patient not taking: Reported on 05/05/2024), Disp: 2 tablet, Rfl: 0 .  fluconazole (DIFLUCAN) 150 mg tablet, Take 150 mg by mouth every other day. (Patient not taking: Reported  on 05/05/2024), Disp: 2 tablet, Rfl: 2

## 2024-06-29 ENCOUNTER — Encounter: Payer: Self-pay | Admitting: Physician Assistant

## 2024-06-29 ENCOUNTER — Ambulatory Visit: Admitting: Physician Assistant

## 2024-06-29 VITALS — BP 122/68 | HR 89 | Ht 62.0 in | Wt 224.6 lb

## 2024-06-29 DIAGNOSIS — E282 Polycystic ovarian syndrome: Secondary | ICD-10-CM | POA: Diagnosis not present

## 2024-06-29 DIAGNOSIS — F32A Depression, unspecified: Secondary | ICD-10-CM

## 2024-06-29 DIAGNOSIS — Z3200 Encounter for pregnancy test, result unknown: Secondary | ICD-10-CM

## 2024-06-29 LAB — POCT URINE PREGNANCY: Preg Test, Ur: NEGATIVE

## 2024-06-29 NOTE — Progress Notes (Signed)
 PAP 04/2024; pt states normal results.   Pt trying to conceive. States she was diagnosed with PCOS at 23 years of age. Pt would like to discuss this with provider. Hoping to find ways to become healthier for pregnancy and control PCOS symptoms.   Pt states she was diagnosed with depression last year.   PHQ of 12 and 16 on GAD; referral sent; pt aware and agreeable to counselor reaching out.

## 2024-06-29 NOTE — Progress Notes (Signed)
 GYNECOLOGY  VISIT   HPI: Karen Yoder is a 23 y.o.   single female [id number] Referred to us  by her PCP for TTC with history of PCOS. Patient previously experienced periods that were irregular and spaced far apart. She endorses hair thinning and excessive facial hair. Has been on metformin previously. Now on 10 days prometrium.  GYNECOLOGIC HISTORY: Patient's last menstrual period was 06/12/2024 (exact date). Contraception: none.   Menopausal hormone therapy:  premenopausal Last mammogram:  never done due to age Last pap smear: never done due to age  35 History     Gravida  0   Para  0   Term  0   Preterm  0   AB  0   Living  0      SAB  0   IAB  0   Ectopic  0   Multiple  0   Live Births  0              There are no active problems to display for this patient.   History reviewed. No pertinent past medical history.  History reviewed. No pertinent surgical history.  Current Outpatient Medications  Medication Sig Dispense Refill   cetirizine (ZYRTEC) 10 MG tablet Take 10 mg by mouth daily.     escitalopram (LEXAPRO) 20 MG tablet Take 20 mg by mouth daily.     fluticasone (FLONASE) 50 MCG/ACT nasal spray Place 2 sprays into both nostrils daily.     No current facility-administered medications for this visit.     ALLERGIES: Patient has no known allergies.  History reviewed. No pertinent family history.  Social History   Socioeconomic History   Marital status: Married    Spouse name: Not on file   Number of children: Not on file   Years of education: Not on file   Highest education level: Not on file  Occupational History   Not on file  Tobacco Use   Smoking status: Never   Smokeless tobacco: Never  Substance and Sexual Activity   Alcohol use: Yes   Drug use: Yes    Types: Marijuana   Sexual activity: Yes    Birth control/protection: None  Other Topics Concern   Not on file  Social History Narrative   Not on file   Social Drivers of  Health   Financial Resource Strain: Low Risk  (04/28/2024)   Received [facility]   Overall Financial Resource Strain (CARDIA)    How hard is it for you to pay for the very basics like food, housing, medical care, and heating?: Not hard at all  Food Insecurity: No Food Insecurity (04/28/2024)   Received from [facility]   Hunger Vital Sign    Within the past 12 months, you worried that your food would run out before you got the money to buy more.: Never true    Within the past 12 months, the food you bought just didn't last and you didn't have money to get more.: Never true  Transportation Needs: No Transportation Needs (04/28/2024)   Received from [facility] - Transportation    In the past 12 months, has lack of transportation kept you from meetings, work, or from getting things needed for daily living?: No    In the past 12 months, has lack of transportation kept you from medical appointments or from getting medications?: No  Physical Activity: Not on file  Stress: Not on file  Social Connections: Not on file  Intimate Partner Violence: Low Risk (03/24/2021)   Received from [facility]   Intimate Partner Violence    Insults You: Not on file    Threatens You: Not on file    Screams at You: Not on file    Physically Hurt: Not on file    Intimate Partner Violence Score: Not on file    Review of Systems  PHYSICAL EXAMINATION:    BP 122/68   Pulse 89   Ht 5' 2 (1.575 m)   Wt 224 lb 9.6 oz (101.9 kg)   LMP 06/12/2024 (Exact Date)   BMI 41.08 kg/m     General appearance: +Large body habitus. Alert, cooperative and appears stated age Head: Normocephalic, without obvious abnormality, atraumatic Neck: +Acanthosis nigricans. No adenopathy, supple, symmetrical, trachea midline and thyroid normal to inspection  Lungs: normal respiratory effort Neurologic: Grossly normal  ASSESSMENT & PLAN   1. PCOS (polycystic ovarian syndrome) (Primary) Pleasant 23 year old  patient with PCOS by oligomenorrhea and signs of hyperandrogenism. She is now trying to conceive and finishing a course of prometrium. She requests thyroid hormone check and pelvic ultrasound to monitor cysts.  - Discussed diagnosis of PCOS and on what basis. Reviewed long term implication of issues with weight loss, metabolic syndrome. - Offered referral to dietician. She accepts. - Discussed use of ovulation kits when she has signs (raw egg white discharge, mittelschmerz, etc.), and starting prenatal vitamin.  - TSH Rfx on Abnormal to Free T4 - HgB A1c - US  PELVIC COMPLETE WITH TRANSVAGINAL; Future - AMB Referral to Nutrition - Ambulatory referral to Endocrinology  2. Possible pregnancy - POCT urine pregnancy  3. Depression, unspecified depression type On lexapro 20 mg. No SI/HI. Open to discussing this with our [facility] providers.  - Ambulatory referral to Integrated Behavioral Health    An After Visit Summary was printed and given to the patient.  [name] [name], [place] 11/13/20252:05 PM

## 2024-06-30 LAB — HEMOGLOBIN A1C
Est. average glucose Bld gHb Est-mCnc: 105 mg/dL
Hgb A1c MFr Bld: 5.3 % (ref 4.8–5.6)

## 2024-06-30 LAB — TSH RFX ON ABNORMAL TO FREE T4: TSH: 1.55 u[IU]/mL (ref 0.450–4.500)

## 2024-07-03 ENCOUNTER — Ambulatory Visit: Admitting: Internal Medicine

## 2024-07-04 ENCOUNTER — Ambulatory Visit (HOSPITAL_COMMUNITY): Attending: Physician Assistant

## 2024-07-27 ENCOUNTER — Other Ambulatory Visit (HOSPITAL_BASED_OUTPATIENT_CLINIC_OR_DEPARTMENT_OTHER)

## 2024-07-27 ENCOUNTER — Ambulatory Visit: Admitting: Internal Medicine

## 2024-07-27 NOTE — Progress Notes (Deleted)
 NEW PATIENT Date of Service/Encounter:   07/27/2024 Referring provider: {Blank single:19197::No ref. provider found,none-self referred} Primary care provider: Patient, No Pcp Per  Subjective:  Karen Yoder is a 23 y.o. female with a PMHx of *** presenting today for evaluation of *** History obtained from: chart review and {Persons; PED relatives w/patient:19415::patient}.   Discussed the use of AI scribe software for clinical note transcription with the patient, who gave verbal consent to proceed.  History of Present Illness      Chart Review:  Reviewed PCP notes from referral ***: ***  Other allergy screening: Asthma: {Blank single:19197::yes,no} Rhino conjunctivitis: {Blank single:19197::yes,no} Food allergy: {Blank single:19197::yes,no} Medication allergy: {Blank single:19197::yes,no} Hymenoptera allergy: {Blank single:19197::yes,no} Urticaria: {Blank single:19197::yes,no} Eczema:{Blank single:19197::yes,no} History of recurrent infections suggestive of immunodeficency: {Blank single:19197::yes,no} ***Vaccinations are up to date.   Past Medical History: No past medical history on file. Medication List:  Current Outpatient Medications  Medication Sig Dispense Refill   cetirizine (ZYRTEC) 10 MG tablet Take 10 mg by mouth daily.     escitalopram (LEXAPRO) 20 MG tablet Take 20 mg by mouth daily.     fluticasone (FLONASE) 50 MCG/ACT nasal spray Place 2 sprays into both nostrils daily.     No current facility-administered medications for this visit.   Known Allergies:  Allergies[1] Past Surgical History: No past surgical history on file. Family History: No family history on file. Social History: Karen Yoder lives ***.   ROS:  All other systems negative except as noted per HPI.  Objective:  Last menstrual period 06/12/2024. There is no height or weight on file to calculate BMI. Physical Exam:  General Appearance:  Alert,  cooperative, no distress, appears stated age  Head:  Normocephalic, without obvious abnormality, atraumatic  Eyes:  Conjunctiva clear, EOM's intact  Ears {Blank multiple:19196:a:***,EACs normal bilaterally,normal TMs bilaterally,ear tubes present bilaterally without exudate}  Nose: Nares normal, {Blank multiple:19196:a:***,hypertrophic turbinates,normal mucosa,no visible anterior polyps,septum midline}  Throat: Lips, tongue normal; teeth and gums normal, {Blank multiple:19196:a:***,normal posterior oropharynx,tonsils 2+,tonsils 3+,no tonsillar exudate,+ cobblestoning,surgically absent tonsils,mildly erythematous posterior oropharynx}  Neck: Supple, symmetrical  Lungs:   {Blank multiple:19196:a:***,clear to auscultation bilaterally,end-expiratory wheezing,wheezing throughout}, Respirations unlabored, {Blank multiple:19196:a:***,no coughing,intermittent dry coughing,intermittent productive-sounding cough}  Heart:  {Blank multiple:19196:a:***,regular rate and rhythm,no murmur}, Appears well perfused  Extremities: No edema  Skin: {Blank multiple:19196:a:***,erythematous, dry patches scattered on ***,lichenification on ***,Skin color, texture, turgor normal,no rashes or lesions on visualized portions of skin}  Neurologic: No gross deficits   Diagnostics: Spirometry:  Tracings reviewed. Her effort: {Blank single:19197::Good reproducible efforts.,It was hard to get consistent efforts and there is a question as to whether this reflects a maximal maneuver.,Poor effort, data can not be interpreted.,Variable effort-results affected,effort okay for first attempt at spirometry.,Results not reproducible due to ***} FVC: ***L (pre), ***L  (post) FEV1: ***L, ***% predicted (pre), ***L, ***% predicted (post) FEV1/FVC ratio: *** (pre), *** (post) Interpretation: {Blank single:19197::Spirometry consistent with mild obstructive  disease,Spirometry consistent with moderate obstructive disease,Spirometry consistent with severe obstructive disease,Spirometry consistent with possible restrictive disease,Spirometry consistent with mixed obstructive and restrictive disease,Spirometry uninterpretable due to technique,Spirometry consistent with normal pattern,No overt abnormalities noted given today's efforts,Nonobstructive ratio, low FEV1,Nonobstructive ratio, low FEV1, possible restriction}.  Please see scanned spirometry results for details.  Skin Testing: {Blank single:19197::Select foods,Environmental allergy panel,Environmental allergy panel and select foods,Food allergy panel,None,Deferred due to recent antihistamines use,deferred due to recent reaction,Pediatric Environmental Allergy Panel,Pediatric Food Panel,Select foods and environmental allergies}. {Blank single:19197::Adequate positive and negative controls,Inadequate positive control-testing invalid,Adequate positive and negative controls, dermatographism  present, testing difficult to interpret}. Results discussed with patient/family.   {Blank single:19197::Allergy testing results were read and interpreted by myself, documented by clinical staff.,Allergy testing results were read by ***,FNP, documented by clinical staff}  Labs:  Lab Orders  No laboratory test(s) ordered today     Assessment and Plan  Assessment and Plan Assessment & Plan       {Blank single:19197::This note in its entirety was forwarded to the Provider who requested this consultation.}  Other: {Blank multiple:19196:a:***,samples provided of: ***,school forms provided,reviewed spirometry technique,reviewed inhaler technique}  Thank you for your kind referral. I appreciate the opportunity to take part in [facility] care. Please do not hesitate to contact me with questions.***  Sincerely,  Rocky Endow, MD [facility]          [1] No Known Allergies

## 2024-07-28 ENCOUNTER — Ambulatory Visit (HOSPITAL_BASED_OUTPATIENT_CLINIC_OR_DEPARTMENT_OTHER)

## 2024-07-30 ENCOUNTER — Ambulatory Visit (HOSPITAL_BASED_OUTPATIENT_CLINIC_OR_DEPARTMENT_OTHER): Admission: RE | Admit: 2024-07-30 | Discharge: 2024-07-30 | Attending: Physician Assistant

## 2024-07-30 DIAGNOSIS — E282 Polycystic ovarian syndrome: Secondary | ICD-10-CM

## 2024-08-13 ENCOUNTER — Ambulatory Visit: Payer: Self-pay | Admitting: Physician Assistant

## 2024-08-13 ENCOUNTER — Encounter: Payer: Self-pay | Admitting: Physician Assistant

## 2024-08-13 DIAGNOSIS — E282 Polycystic ovarian syndrome: Secondary | ICD-10-CM | POA: Insufficient documentation

## 2024-08-18 ENCOUNTER — Encounter: Payer: Self-pay | Admitting: Physician Assistant

## 2024-08-24 ENCOUNTER — Telehealth: Payer: Self-pay

## 2024-08-28 ENCOUNTER — Ambulatory Visit: Payer: Self-pay | Admitting: Family

## 2024-08-30 ENCOUNTER — Ambulatory Visit: Payer: Self-pay | Admitting: Physician Assistant

## 2024-08-31 ENCOUNTER — Telehealth: Payer: Self-pay

## 2024-09-06 ENCOUNTER — Telehealth: Payer: Self-pay

## 2024-09-13 ENCOUNTER — Telehealth: Payer: Self-pay

## 2024-09-15 ENCOUNTER — Encounter: Payer: Self-pay | Admitting: Obstetrics and Gynecology

## 2024-09-15 ENCOUNTER — Encounter: Payer: Self-pay | Admitting: Physician Assistant

## 2024-09-15 ENCOUNTER — Ambulatory Visit: Admitting: Physician Assistant

## 2024-10-02 ENCOUNTER — Ambulatory Visit: Admitting: Obstetrics and Gynecology
